# Patient Record
Sex: Male | Born: 1990 | Race: Black or African American | Hispanic: No | Marital: Single | Smoking: Current every day smoker
Health system: Southern US, Community
[De-identification: ages and names within clinical notes are randomized; demographics above are authoritative.]

---
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
## 2011-07-03 ENCOUNTER — Inpatient Hospital Stay (INDEPENDENT_AMBULATORY_CARE_PROVIDER_SITE_OTHER)
Admission: RE | Admit: 2011-07-03 | Discharge: 2011-07-03 | Disposition: A | Discharge status: Home / Self Care | Payer: Self-pay | Source: Ambulatory Visit | Attending: Emergency Medicine | Admitting: Emergency Medicine

## 2011-07-03 DIAGNOSIS — S0003XA Contusion of scalp, initial encounter: Secondary | ICD-10-CM

## 2011-07-03 LAB — RAPID URINE DRUG SCREEN, HOSP PERFORMED
Amphetamines: NOT DETECTED
Barbiturates: NOT DETECTED
Benzodiazepines: NOT DETECTED
Cocaine: NOT DETECTED
Opiates: NOT DETECTED
Tetrahydrocannabinol: NOT DETECTED

## 2011-10-16 ENCOUNTER — Emergency Department (HOSPITAL_COMMUNITY)
Admission: EM | Admit: 2011-10-16 | Discharge: 2011-10-16 | Payer: Self-pay | Source: Home / Self Care | Attending: Family Medicine | Admitting: Family Medicine

## 2011-10-16 NOTE — ED Notes (Signed)
Pt is no longer at [facility], per Registration Pt left [facility].

## 2014-04-11 ENCOUNTER — Encounter (HOSPITAL_COMMUNITY): Payer: Self-pay | Admitting: Emergency Medicine

## 2014-04-11 ENCOUNTER — Emergency Department (HOSPITAL_COMMUNITY)
Admission: EM | Admit: 2014-04-11 | Discharge: 2014-04-11 | Disposition: A | Discharge status: Home / Self Care | Payer: Self-pay | Attending: Emergency Medicine | Admitting: Emergency Medicine

## 2014-04-11 DIAGNOSIS — K625 Hemorrhage of anus and rectum: Secondary | ICD-10-CM | POA: Insufficient documentation

## 2014-04-11 DIAGNOSIS — R110 Nausea: Secondary | ICD-10-CM | POA: Insufficient documentation

## 2014-04-11 DIAGNOSIS — F17200 Nicotine dependence, unspecified, uncomplicated: Secondary | ICD-10-CM | POA: Insufficient documentation

## 2014-04-11 DIAGNOSIS — R197 Diarrhea, unspecified: Secondary | ICD-10-CM | POA: Insufficient documentation

## 2014-04-11 LAB — COMPREHENSIVE METABOLIC PANEL
ALT: 25 U/L (ref 0–53)
AST: 23 U/L (ref 0–37)
Albumin: 3.9 g/dL (ref 3.5–5.2)
Alkaline Phosphatase: 87 U/L (ref 39–117)
Anion gap: 11 (ref 5–15)
BUN: 19 mg/dL (ref 6–23)
CO2: 24 mEq/L (ref 19–32)
Calcium: 9.1 mg/dL (ref 8.4–10.5)
Chloride: 109 mEq/L (ref 96–112)
Creatinine, Ser: 1.14 mg/dL (ref 0.50–1.35)
GFR calc Af Amer: >90 mL/min (ref >90)
GFR calc non Af Amer: >90 mL/min (ref >90)
Glucose, Bld: 84 mg/dL (ref 70–99)
Potassium: 4.4 mEq/L (ref 3.7–5.3)
Sodium: 144 mEq/L (ref 137–147)
Total Bilirubin: 0.3 mg/dL (ref 0.3–1.2)
Total Protein: 7.7 g/dL (ref 6.0–8.3)

## 2014-04-11 LAB — URINE MICROSCOPIC-ADD ON

## 2014-04-11 LAB — CBC WITH DIFFERENTIAL/PLATELET
Basophils Absolute: 0 10*3/uL (ref 0.0–0.1)
Basophils Relative: 0 % (ref 0–1)
Eosinophils Absolute: 0.1 10*3/uL (ref 0.0–0.7)
Eosinophils Relative: 1 % (ref 0–5)
HCT: 44.4 % (ref 39.0–52.0)
Hemoglobin: 15 g/dL (ref 13.0–17.0)
Lymphocytes Relative: 20 % (ref 12–46)
Lymphs Abs: 2.7 10*3/uL (ref 0.7–4.0)
MCH: 27.6 pg (ref 26.0–34.0)
MCHC: 33.8 g/dL (ref 30.0–36.0)
MCV: 81.8 fL (ref 78.0–100.0)
Monocytes Absolute: 1.2 10*3/uL — ABNORMAL HIGH (ref 0.1–1.0)
Monocytes Relative: 9 % (ref 3–12)
Neutro Abs: 9.5 10*3/uL — ABNORMAL HIGH (ref 1.7–7.7)
Neutrophils Relative %: 70 % (ref 43–77)
Platelets: 235 10*3/uL (ref 150–400)
RBC: 5.43 MIL/uL (ref 4.22–5.81)
RDW: 14.4 % (ref 11.5–15.5)
WBC: 13.5 10*3/uL — ABNORMAL HIGH (ref 4.0–10.5)

## 2014-04-11 LAB — URINALYSIS, ROUTINE W REFLEX MICROSCOPIC
Bilirubin Urine: NEGATIVE
Glucose, UA: NEGATIVE mg/dL
Ketones, ur: NEGATIVE mg/dL
Leukocytes, UA: NEGATIVE
Nitrite: NEGATIVE
Protein, ur: NEGATIVE mg/dL
Specific Gravity, Urine: 1.024 (ref 1.005–1.030)
Urobilinogen, UA: 0.2 mg/dL (ref 0.0–1.0)
pH: 6 (ref 5.0–8.0)

## 2014-04-11 LAB — POC OCCULT BLOOD, ED: Fecal Occult Bld: POSITIVE — AB

## 2014-04-11 MED ORDER — LOPERAMIDE HCL 2 MG PO CAPS
2.0000 mg | ORAL_CAPSULE | Freq: Once | ORAL | Status: AC
  Administered 2014-04-11: 2 mg via ORAL
  Filled 2014-04-11: qty 1

## 2014-04-11 NOTE — ED Notes (Signed)
EMT assisted with POC rectal exam

## 2014-04-11 NOTE — ED Notes (Signed)
Pt. reports diarrhea , nausea and low abdominal cramping onset 2 days ago , rectal bleeding today . Denies fever or chills.

## 2014-04-11 NOTE — ED Provider Notes (Signed)
CSN: [id number]     Arrival date & time 04/11/14  2029 History   First MD Initiated Contact with Patient 04/11/14 2115     Chief Complaint  Patient presents with  . Diarrhea  . Rectal Bleeding     (Consider location/radiation/quality/duration/timing/severity/associated sxs/prior Treatment) HPI Comments: Patient states he's had diarrhea and nausea.  For the past 2, days.  He, states the diarrhea is improving, so this, afternoon.  He took a laxative thinking that it would for his stools at 2:00 and 6:00 he had bowel movements and when he wiped.  There was blood on the tissue and a small amount of blood in the stool.  He denies any abdominal pain, fevers, dysuria, previous history of same   Patient is a 23 y.o. male presenting with diarrhea and hematochezia. The history is provided by the patient.  Diarrhea Quality:  Blood-tinged Severity:  Mild Onset quality:  Sudden Timing:  Intermittent Progression:  Unchanged Relieved by:  Nothing Worsened by:  Nothing tried Ineffective treatments:  None tried Associated symptoms: no abdominal pain, no chills, no fever, no myalgias and no vomiting   Rectal Bleeding Associated symptoms: no abdominal pain, no dizziness, no fever, no light-headedness and no vomiting     History reviewed. No pertinent past medical history. History reviewed. No pertinent past surgical history. No family history on file. History  Substance Use Topics  . Smoking status: Current Every Day Smoker  . Smokeless tobacco: Not on file  . Alcohol Use: Yes    Review of Systems  Constitutional: Negative for fever and chills.  Gastrointestinal: Positive for nausea, diarrhea, blood in stool and hematochezia. Negative for vomiting and abdominal pain.  Genitourinary: Negative for dysuria.  Musculoskeletal: Negative for myalgias.  Skin: Negative for wound.  Neurological: Negative for dizziness and light-headedness.  All other systems reviewed and are  negative.     Allergies  Review of patient's allergies indicates no known allergies.  Home Medications   Prior to Admission medications   Not on File   BP 142/87  Pulse 96  Temp(Src) 98.5 F (36.9 C) (Oral)  Resp 18  Ht 5\' 8"  (1.727 m)  Wt 279 lb 8 oz (126.78 kg)  BMI 42.51 kg/m2  SpO2 99% Physical Exam  Nursing note and vitals reviewed. Constitutional: He appears well-developed and well-nourished.  HENT:  Head: Normocephalic.  Mouth/Throat: Oropharynx is clear and moist.  Eyes: Pupils are equal, round, and reactive to light.  Neck: Normal range of motion.  Cardiovascular: Normal rate and regular rhythm.   Pulmonary/Chest: Effort normal and breath sounds normal.  Abdominal: Soft. Bowel sounds are normal. He exhibits no distension. There is no tenderness.  Genitourinary: Guaiac positive stool.  Musculoskeletal: Normal range of motion.  Neurological: He is alert.  Skin: Skin is warm.    ED Course  Procedures (including critical care time) Labs Review Labs Reviewed  URINALYSIS, ROUTINE W REFLEX MICROSCOPIC - Abnormal; Notable for the following:    Hgb urine dipstick TRACE (*)    All other components within normal limits  CBC WITH DIFFERENTIAL - Abnormal; Notable for the following:    WBC 13.5 (*)    Neutro Abs 9.5 (*)    Monocytes Absolute 1.2 (*)    All other components within normal limits  POC OCCULT BLOOD, ED - Abnormal; Notable for the following:    Fecal Occult Bld POSITIVE (*)    All other components within normal limits  COMPREHENSIVE METABOLIC PANEL  URINE MICROSCOPIC-ADD ON  Imaging Review No results found.   EKG Interpretation None      MDM  Patient.  Won't work, and urine are normal.  He does have a positive guaiac test on a stool card, but do to a more than adequate.  Hemoglobin and hematocrit, doubt that this is a lower GI bleed, but more likely, a irritation from his recent diarrhea.  Patient will be discharged home with Imodium with  strict instructions to return if he has worsening.  Symptoms Final diagnoses:  Diarrhea  Rectal bleed         Arman Filter, NP 04/11/14 2209

## 2014-04-11 NOTE — ED Notes (Signed)
VSS. Pt given discharge instructions. All questions answered. Pt ambulatory on discharge.

## 2014-04-11 NOTE — Discharge Instructions (Signed)
Bloody Stools Bloody stools means there is blood in your poop (stool). It is a sign that there is a problem somewhere in the digestive system. It is important for your doctor to find the cause of your bleeding, so the problem can be treated.  HOME CARE  Only take medicine as told by your doctor.  Eat foods with fiber (prunes, bran cereals).  Drink enough fluids to keep your pee (urine) clear or pale yellow.  Sit in warm water (sitz bath) for 10 to 15 minutes as told by your doctor.  Know how to take your medicines (enemas, suppositories) if advised by your doctor.  Watch for signs that you are getting better or getting worse. GET HELP RIGHT AWAY IF:   You are not getting better.  You start to get better but then get worse again.  You have new problems.  You have severe bleeding from the place where poop comes out (rectum) that does not stop.  You throw up (vomit) blood.  You feel weak or pass out (faint).  You have a fever. MAKE SURE YOU:   Understand these instructions.  Will watch your condition.  Will get help right away if you are not doing well or get worse. Document Released: 08/19/2009 Document Revised: 11/23/2011 Document Reviewed: 01/16/2011 [facility] Patient Information 2015 [place], [place]. This information is not intended to replace advice given to you by your health care provider. Make sure you discuss any questions you have with your health care provider. U. been given a medication in the emergency department, with Imodium.  This should help from your stools, up we'll also begin a dietary outline.  Please try to follow this for the next 24-48 hours, you do have a small amount of blood in your stool sample, but your hemoglobin and hematocrit are more than adequate and, you do not have any sign of infection.  Please watch her stools carefully over the next 2 days if you continue to have a large amount of bleeding or increased amount of bleeding from the rectum.  To  become weak, or dizzy.  Please return immediately for further evaluation

## 2014-04-12 NOTE — ED Provider Notes (Signed)
  Medical screening examination/treatment/procedure(s) were performed by non-physician practitioner and as supervising physician I was immediately available for consultation/collaboration.   EKG Interpretation None         Gerhard Munchobert Victoriana Aziz, MD 04/12/14 [phone]

## 2016-07-12 ENCOUNTER — Emergency Department (HOSPITAL_COMMUNITY): Payer: Self-pay

## 2016-07-12 ENCOUNTER — Encounter (HOSPITAL_COMMUNITY): Payer: Self-pay | Admitting: Emergency Medicine

## 2016-07-12 ENCOUNTER — Emergency Department (HOSPITAL_COMMUNITY)
Admission: EM | Admit: 2016-07-12 | Discharge: 2016-07-12 | Disposition: A | Discharge status: Home / Self Care | Payer: Self-pay | Attending: Emergency Medicine | Admitting: Emergency Medicine

## 2016-07-12 DIAGNOSIS — R0789 Other chest pain: Secondary | ICD-10-CM | POA: Insufficient documentation

## 2016-07-12 DIAGNOSIS — F17200 Nicotine dependence, unspecified, uncomplicated: Secondary | ICD-10-CM | POA: Insufficient documentation

## 2016-07-12 MED ORDER — GI COCKTAIL ~~LOC~~
30.0000 mL | Freq: Once | ORAL | Status: AC
  Administered 2016-07-12: 30 mL via ORAL
  Filled 2016-07-12: qty 30

## 2016-07-12 MED ORDER — IBUPROFEN 800 MG PO TABS: 800.0000 mg | ORAL_TABLET | Freq: Three times a day (TID) | ORAL | 0 refills | Status: DC

## 2016-07-12 MED ORDER — KETOROLAC TROMETHAMINE 60 MG/2ML IM SOLN
60.0000 mg | Freq: Once | INTRAMUSCULAR | Status: AC
  Administered 2016-07-12: 60 mg via INTRAMUSCULAR
  Filled 2016-07-12: qty 2

## 2016-07-12 NOTE — ED Provider Notes (Signed)
[facility]-EMERGENCY DEPT Provider Note   CSN: [id number] Arrival date & time: 07/12/16  0122   By signing my name below, I, Suzan Slick. Elon Spanner, attest that this documentation has been prepared under the direction and in the presence of Alecea Trego, MD.  Electronically Signed: Suzan Slick. Elon Spanner, ED Scribe. 07/12/16. 3:14 AM.   History   Chief Complaint Chief Complaint  Patient presents with  . Chest Pain   The history is provided by the patient. No language interpreter was used.  Chest Pain   This is a new problem. The current episode started more than 1 week ago. The problem has not changed since onset.Associated with: positions. The pain is present in the substernal region. The pain is at a severity of 1/10. The pain is mild. The quality of the pain is described as dull. The pain does not radiate. The symptoms are aggravated by certain positions. Pertinent negatives include no abdominal pain, no diaphoresis, no exertional chest pressure, no fever, no nausea, no orthopnea, no palpitations and no vomiting. He has tried nothing for the symptoms. The treatment provided no relief.    HPI Comments: Samuel Zamora is a 25 y.o. male without any pertinent past medical history who presents to the Emergency Department complaining of intermittent, unchanged L sided chest pain x few weeks. Pain is described as tightness and rated 1/10 currently. No recent heavy lifting or repetitive motions. Pt states chest pain is worse during the day. Typically pain is relieved some when standing. He also reports intermittent shortness of breath and HAs. No OTC medications or home remedies attempted prior to arrival. No recent fever, chills, nausea, or vomiting. No recent long distance travel. No recent surgeries. Pt admits to occasional Marijuana use.  PCP: No PCP Per Patient    History reviewed. No pertinent past medical history.  There are no active problems to display for this patient.   History reviewed. No  pertinent surgical history.   Family History No family history on file.  Social History Social History  Substance Use Topics  . Smoking status: Current Every Day Smoker  . Smokeless tobacco: Never Used  . Alcohol use Yes     Allergies   Review of patient's allergies indicates no known allergies.   Review of Systems Review of Systems  Constitutional: Negative for chills, diaphoresis and fever.  Cardiovascular: Positive for chest pain. Negative for palpitations and orthopnea.  Gastrointestinal: Negative for abdominal pain, nausea and vomiting.  Psychiatric/Behavioral: Negative for confusion.  All other systems reviewed and are negative.    Physical Exam Updated Vital Signs BP 121/90 (BP Location: Left Arm)   Pulse 91   Temp 98.4 F (36.9 C) (Oral)   Resp 18   Ht 5\' 9"  (1.753 m)   SpO2 99%   Physical Exam  Constitutional: He is oriented to person, place, and time. He appears well-developed and well-nourished.  HENT:  Head: Normocephalic and atraumatic.  Mouth/Throat: Oropharynx is clear and moist.  Eyes: EOM are normal.  Neck: Normal range of motion.  No bruits. Trachea is midline.  Cardiovascular: Normal rate, regular rhythm, normal heart sounds and intact distal pulses.   Pulmonary/Chest: Effort normal and breath sounds normal. No stridor. No respiratory distress. He has no wheezes. He has no rales. He exhibits tenderness.  Abdominal: Soft. Bowel sounds are normal. He exhibits no distension. There is no tenderness.  Musculoskeletal: Normal range of motion.  Neurological: He is alert and oriented to person, place, and time.  Skin: Skin  is warm and dry. Capillary refill takes less than 2 seconds.  Psychiatric: He has a normal mood and affect. Judgment normal.  Nursing note and vitals reviewed.    ED Treatments / Results   Vitals:   07/12/16 0133 07/12/16 0336  BP: 121/90 132/95  Pulse: 91 69  Resp: 18 17  Temp: 98.4 F (36.9 C)    Results for orders  placed or performed during the hospital encounter of 04/11/14  Urinalysis, Routine w reflex microscopic  Result Value Ref Range   Color, Urine YELLOW YELLOW   APPearance CLEAR CLEAR   Specific Gravity, Urine 1.024 1.005 - 1.030   pH 6.0 5.0 - 8.0   Glucose, UA NEGATIVE NEGATIVE mg/dL   Hgb urine dipstick TRACE (A) NEGATIVE   Bilirubin Urine NEGATIVE NEGATIVE   Ketones, ur NEGATIVE NEGATIVE mg/dL   Protein, ur NEGATIVE NEGATIVE mg/dL   Urobilinogen, UA 0.2 0.0 - 1.0 mg/dL   Nitrite NEGATIVE NEGATIVE   Leukocytes, UA NEGATIVE NEGATIVE  CBC with Differential  Result Value Ref Range   WBC 13.5 (H) 4.0 - 10.5 K/uL   RBC 5.43 4.22 - 5.81 MIL/uL   Hemoglobin 15.0 13.0 - 17.0 g/dL   HCT 16.144.4 09.639.0 - 04.552.0 %   MCV 81.8 78.0 - 100.0 fL   MCH 27.6 26.0 - 34.0 pg   MCHC 33.8 30.0 - 36.0 g/dL   RDW 40.914.4 81.111.5 - 91.415.5 %   Platelets 235 150 - 400 K/uL   Neutrophils Relative % 70 43 - 77 %   Neutro Abs 9.5 (H) 1.7 - 7.7 K/uL   Lymphocytes Relative 20 12 - 46 %   Lymphs Abs 2.7 0.7 - 4.0 K/uL   Monocytes Relative 9 3 - 12 %   Monocytes Absolute 1.2 (H) 0.1 - 1.0 K/uL   Eosinophils Relative 1 0 - 5 %   Eosinophils Absolute 0.1 0.0 - 0.7 K/uL   Basophils Relative 0 0 - 1 %   Basophils Absolute 0.0 0.0 - 0.1 K/uL  Comprehensive metabolic panel  Result Value Ref Range   Sodium 144 137 - 147 mEq/L   Potassium 4.4 3.7 - 5.3 mEq/L   Chloride 109 96 - 112 mEq/L   CO2 24 19 - 32 mEq/L   Glucose, Bld 84 70 - 99 mg/dL   BUN 19 6 - 23 mg/dL   Creatinine, Ser 7.821.14 0.50 - 1.35 mg/dL   Calcium 9.1 8.4 - 95.610.5 mg/dL   Total Protein 7.7 6.0 - 8.3 g/dL   Albumin 3.9 3.5 - 5.2 g/dL   AST 23 0 - 37 U/L   ALT 25 0 - 53 U/L   Alkaline Phosphatase 87 39 - 117 U/L   Total Bilirubin 0.3 0.3 - 1.2 mg/dL   GFR calc non Af Amer >90 >90 mL/min   GFR calc Af Amer >90 >90 mL/min   Anion gap 11 5 - 15  Urine microscopic-add on  Result Value Ref Range   Squamous Epithelial / LPF RARE RARE   WBC, UA 0-2 <3 WBC/hpf    RBC / HPF 3-6 <3 RBC/hpf   Bacteria, UA RARE RARE   Urine-Other MUCOUS PRESENT   POC occult blood, ED RN will collect  Result Value Ref Range   Fecal Occult Bld POSITIVE (A) NEGATIVE   Dg Chest 2 View  Result Date: 07/12/2016 CLINICAL DATA:  Chest tightness and shortness of breath EXAM: CHEST  2 VIEW COMPARISON:  None. FINDINGS: Heart size is borderline enlarged. No edema.  No infiltrate. No effusion. No pneumothorax. IMPRESSION: Borderline cardiomegaly.  No acute infiltrate or edema. Electronically Signed   By: Jasmine PangKim  Fujinaga M.D.   On: 07/12/2016 01:54    DIAGNOSTIC STUDIES: Oxygen Saturation is 99% on RA, Normal by my interpretation.    COORDINATION OF CARE: 3:15 AM- Will order blood work and EKG. Will give GI cocktail and Toradol. Discussed treatment plan with pt at bedside and pt agreed to plan.    EKG  EKG Interpretation  Date/Time:  Sunday July 12 2016 01:45:29 EDT Ventricular Rate:  83 PR Interval:    QRS Duration: 90 QT Interval:  340 QTC Calculation: 400 R Axis:   31 Text Interpretation:  Sinus rhythm Confirmed by [facility] M Yel[facility] CommunityALUMBO-RASCH  MD, Wylder Macomber ([id number]) on 07/12/2016 1:55:56 AM       Radiology Dg Chest 2 View  Result Date: 07/12/2016 CLINICAL DATA:  Chest tightness and shortness of breath EXAM: CHEST  2 VIEW COMPARISON:  None. FINDINGS: Heart size is borderline enlarged. No edema. No infiltrate. No effusion. No pneumothorax. IMPRESSION: Borderline cardiomegaly.  No acute infiltrate or edema. Electronically Signed   By: Jasmine PangKim  Fujinaga M.D.   On: 07/12/2016 01:54    Procedures Procedures (including critical care time)  Medications Ordered in ED Medications  gi cocktail (Maalox,Lidocaine,Donnatal) (30 mLs Oral Given 07/12/16 0332)  ketorolac (TORADOL) injection 60 mg (60 mg Intramuscular Given 07/12/16 0332)   PERC negative wells 0 highly doubt PE in this very low risk patient.   HEART score 1 highly doubt cardiac chest pain as EKG is normal and symptoms are  ongoing for days   Final Clinical Impressions(s) / ED Diagnoses  Chest wall pain:   All questions answered to patient's parents satisfaction. Based on history and exam patient has been appropriately medically screened and emergency conditions excluded. Patient is stable for discharge at this time. Follow up with your PMD for recheck in 2 days and strict return precautions given.   New Prescriptions New Prescriptions   No medications on file  I personally performed the services described in this documentation, which was scribed in my presence. The recorded information has been reviewed and is accurate.      Cy BlamerApril Azaylia Fong, MD 07/12/16 [id number]
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
## 2018-04-17 ENCOUNTER — Emergency Department
Admission: EM | Admit: 2018-04-17 | Discharge: 2018-04-17 | Disposition: A | Discharge status: Home / Self Care | Payer: Self-pay | Attending: Emergency Medicine | Admitting: Emergency Medicine

## 2018-04-17 ENCOUNTER — Other Ambulatory Visit: Payer: Self-pay

## 2018-04-17 ENCOUNTER — Encounter: Payer: Self-pay | Admitting: Emergency Medicine

## 2018-04-17 DIAGNOSIS — L03115 Cellulitis of right lower limb: Secondary | ICD-10-CM | POA: Insufficient documentation

## 2018-04-17 DIAGNOSIS — L02415 Cutaneous abscess of right lower limb: Secondary | ICD-10-CM | POA: Insufficient documentation

## 2018-04-17 DIAGNOSIS — F17200 Nicotine dependence, unspecified, uncomplicated: Secondary | ICD-10-CM | POA: Insufficient documentation

## 2018-04-17 LAB — CBC WITH DIFFERENTIAL/PLATELET
Basophils Absolute: 0.1 10*3/uL (ref 0–0.1)
Basophils Relative: 0 %
EOS PCT: 0 %
Eosinophils Absolute: 0.1 10*3/uL (ref 0–0.7)
HEMATOCRIT: 47.6 % (ref 40.0–52.0)
Hemoglobin: 15.7 g/dL (ref 13.0–18.0)
LYMPHS PCT: 7 %
Lymphs Abs: 1.4 10*3/uL (ref 1.0–3.6)
MCH: 27 pg (ref 26.0–34.0)
MCHC: 33 g/dL (ref 32.0–36.0)
MCV: 81.8 fL (ref 80.0–100.0)
MONOS PCT: 7 %
Monocytes Absolute: 1.5 10*3/uL — ABNORMAL HIGH (ref 0.2–1.0)
NEUTROS ABS: 18.1 10*3/uL — AB (ref 1.4–6.5)
Neutrophils Relative %: 86 %
PLATELETS: 226 10*3/uL (ref 150–440)
RBC: 5.82 MIL/uL (ref 4.40–5.90)
RDW: 15 % — ABNORMAL HIGH (ref 11.5–14.5)
WBC: 21.2 10*3/uL — ABNORMAL HIGH (ref 3.8–10.6)

## 2018-04-17 LAB — BASIC METABOLIC PANEL
ANION GAP: 8 (ref 5–15)
BUN: 16 mg/dL (ref 6–20)
CO2: 26 mmol/L (ref 22–32)
Calcium: 9 mg/dL (ref 8.9–10.3)
Chloride: 103 mmol/L (ref 98–111)
Creatinine, Ser: 1.09 mg/dL (ref 0.61–1.24)
GFR calc Af Amer: >60 mL/min (ref >60)
GFR calc non Af Amer: >60 mL/min (ref >60)
GLUCOSE: 100 mg/dL — AB (ref 70–99)
POTASSIUM: 3.4 mmol/L — AB (ref 3.5–5.1)
SODIUM: 137 mmol/L (ref 135–145)

## 2018-04-17 MED ORDER — CEPHALEXIN 500 MG PO CAPS: 500.0000 mg | ORAL_CAPSULE | Freq: Two times a day (BID) | ORAL | 0 refills | Status: AC

## 2018-04-17 MED ORDER — SULFAMETHOXAZOLE-TRIMETHOPRIM 800-160 MG PO TABS: 1.0000 | ORAL_TABLET | Freq: Two times a day (BID) | ORAL | 0 refills | Status: DC

## 2018-04-17 MED ORDER — SODIUM CHLORIDE 0.9 % IV SOLN
1.0000 g | Freq: Once | INTRAVENOUS | Status: AC
  Administered 2018-04-17: 1 g via INTRAVENOUS
  Filled 2018-04-17: qty 10

## 2018-04-17 MED ORDER — NAPROXEN 500 MG PO TABS: 500.0000 mg | ORAL_TABLET | Freq: Two times a day (BID) | ORAL | 0 refills | Status: DC

## 2018-04-17 MED ORDER — SODIUM CHLORIDE 0.9 % IV BOLUS
1000.0000 mL | Freq: Once | INTRAVENOUS | Status: AC
Start: 2018-04-17 — End: 2018-04-17
  Administered 2018-04-17: 1000 mL via INTRAVENOUS

## 2018-04-17 MED ORDER — HYDROCODONE-ACETAMINOPHEN 5-325 MG PO TABS: 1.0000 | ORAL_TABLET | Freq: Four times a day (QID) | ORAL | 0 refills | Status: AC | PRN

## 2018-04-17 NOTE — ED Provider Notes (Signed)
[facility] Emergency Department Provider Note  ____________________________________________  Time seen: Approximately 3:24 PM  I have reviewed the triage vital signs and the nursing notes.   HISTORY  Chief Complaint Insect Bite   HPI Samuel Zamora is a 27 y.o. male who presents to the emergency department for treatment and evaluation of right lower extremity lesion. He noticed a small pimple like area on the outside of the right knee 2-3 days ago. He squeezed it and got some pus out, but since then the area has become warm, red, and has gotten much larger.   History reviewed. No pertinent past medical history.  There are no active problems to display for this patient.   History reviewed. No pertinent surgical history.  Prior to Admission medications   Medication Sig Start Date End Date Taking? Authorizing Provider  cephALEXin (KEFLEX) 500 MG capsule Take 1 capsule (500 mg total) by mouth 2 (two) times daily for 10 days. 04/17/18 04/27/18  Karmah Potocki, Rulon Eisenmenger B, FNP  HYDROcodone-acetaminophen (NORCO/VICODIN) 5-325 MG tablet Take 1 tablet by mouth every 6 (six) hours as needed for up to 3 days for severe pain. 04/17/18 04/20/18  Leanne Sisler, Rulon Eisenmenger B, FNP  ibuprofen (ADVIL,MOTRIN) 800 MG tablet Take 1 tablet (800 mg total) by mouth 3 (three) times daily. 07/12/16   Palumbo, April, MD  naproxen (NAPROSYN) 500 MG tablet Take 1 tablet (500 mg total) by mouth 2 (two) times daily with a meal. 04/17/18   Carli Lefevers B, FNP  sulfamethoxazole-trimethoprim (BACTRIM DS,SEPTRA DS) 800-160 MG tablet Take 1 tablet by mouth 2 (two) times daily. 04/17/18   Chinita Pester, FNP    Allergies Patient has no known allergies.  No family history on file.  Social History Social History   Tobacco Use  . Smoking status: Current Every Day Smoker  . Smokeless tobacco: Never Used  Substance Use Topics  . Alcohol use: Yes  . Drug use: No    Review of Systems  Constitutional: Positive  for fever. Respiratory: Negative for cough or shortness of breath.  Musculoskeletal: Negative for myalgias Skin: Positive for draining lesion. Neurological: Negative for numbness or paresthesias. ____________________________________________   PHYSICAL EXAM:  VITAL SIGNS: ED Triage Vitals  Enc Vitals Group     BP 04/17/18 1517 132/86     Pulse Rate 04/17/18 1517 (!) 106     Resp 04/17/18 1517 16     Temp 04/17/18 1517 100 F (37.8 C)     Temp Source 04/17/18 1517 Oral     SpO2 04/17/18 1517 95 %     Weight 04/17/18 1508 280 lb (127 kg)     Height 04/17/18 1517 5\' 9"  (1.753 m)     Head Circumference --      Peak Flow --      Pain Score 04/17/18 1508 0     Pain Loc --      Pain Edu? --      Excl. in GC? --      Constitutional: Well appearing. Eyes: Conjunctivae are clear without discharge or drainage. Nose: No rhinorrhea noted. Mouth/Throat: Airway is patent.  Neck: No stridor. Unrestricted range of motion observed. Cardiovascular: Capillary refill is <3 seconds.  Respiratory: Respirations are even and unlabored.. Musculoskeletal: Unrestricted range of motion observed. Neurologic: Awake, alert, and oriented x 4.  Skin: Lesion on the lateral aspect of the proximal calf draining purulent and serosanguineous fluid.  Lesion is indurated.  Surrounding erythema extending upward toward the knee and downward into  the lateral calf.   ____________________________________________   LABS (all labs ordered are listed, but only abnormal results are displayed)  Labs Reviewed  CBC WITH DIFFERENTIAL/PLATELET - Abnormal; Notable for the following components:      Result Value   WBC 21.2 (*)    RDW 15.0 (*)    Neutro Abs 18.1 (*)    Monocytes Absolute 1.5 (*)    All other components within normal limits  BASIC METABOLIC PANEL - Abnormal; Notable for the following components:   Potassium 3.4 (*)    Glucose, Bld 100 (*)    All other components within normal limits    ____________________________________________  EKG  Not indicated. ____________________________________________  RADIOLOGY  Not indicated. ____________________________________________   PROCEDURES  Procedures ____________________________________________   INITIAL IMPRESSION / ASSESSMENT AND PLAN / ED COURSE  Erenest BlankBrandon J Mittelman is a 27 y.o. male who presents to the emergency department for treatment and evaluation of a lesion to the right lower extremity that is been present for the past 2 to 3 days.  Symptoms and exam concerning for cellulitis. Plan will be to check baseline labs, give fluids and IV rocephin.   ----------------------------------------- 4:58 PM on 04/17/2018 -----------------------------------------  Labs discussed with the patient.  He will be treated with double coverage Bactrim and Keflex and advised to use warm compresses 4 times per day.  He will return in 2 days for a recheck of the wound.  He is aware that he should return sooner for any worsening of symptoms including increasing fever, nausea, increase in pain, or increase and size of erythema.    Medications  sodium chloride 0.9 % bolus 1,000 mL (0 mLs Intravenous Stopped 04/17/18 1718)  cefTRIAXone (ROCEPHIN) 1 g in sodium chloride 0.9 % 100 mL IVPB (0 g Intravenous Stopped 04/17/18 1630)     Pertinent labs & imaging results that were available during my care of the patient were reviewed by me and considered in my medical decision making (see chart for details).  ____________________________________________   FINAL CLINICAL IMPRESSION(S) / ED DIAGNOSES  Final diagnoses:  Abscess of leg, right  Cellulitis of right lower extremity    ED Discharge Orders        Ordered    sulfamethoxazole-trimethoprim (BACTRIM DS,SEPTRA DS) 800-160 MG tablet  2 times daily     04/17/18 1712    cephALEXin (KEFLEX) 500 MG capsule  2 times daily     04/17/18 1712    naproxen (NAPROSYN) 500 MG tablet  2 times daily  with meals     04/17/18 1712    HYDROcodone-acetaminophen (NORCO/VICODIN) 5-325 MG tablet  Every 6 hours PRN     04/17/18 1712       Note:  This document was prepared using Dragon voice recognition software and may include unintentional dictation errors.    Chinita Pesterriplett, Kendyll Huettner B, FNP 04/17/18 1904    Phineas SemenGoodman, Graydon, MD 04/17/18 [phone]

## 2018-04-17 NOTE — Discharge Instructions (Signed)
Come back to the ER on Tuesday for a recheck. Come back sooner if symptoms get any worse. Use a warm compress on the area 4 times per day to encourage drainage.

## 2018-04-17 NOTE — ED Notes (Signed)
Pt reports awoke Thursday to a small pimple on the lateral aspect of his right leg just below the knee. Pt states that he popped it and since then the area has gotten worse. Pt with red areas noted to area and open lesion in center. Pt also with slight swelling around his knee.

## 2018-04-17 NOTE — ED Triage Notes (Signed)
C/O spider bite to right lower leg.  Noticed pain and swelling since Thursday.

## 2018-04-17 NOTE — ED Notes (Signed)
Pt verbalizes understanding of d/c instructions, medications and follow up 

## 2018-04-19 ENCOUNTER — Emergency Department
Admission: EM | Admit: 2018-04-19 | Discharge: 2018-04-19 | Disposition: A | Discharge status: Home / Self Care | Payer: Self-pay | Attending: Emergency Medicine | Admitting: Emergency Medicine

## 2018-04-19 ENCOUNTER — Encounter: Payer: Self-pay | Admitting: Emergency Medicine

## 2018-04-19 DIAGNOSIS — L03115 Cellulitis of right lower limb: Secondary | ICD-10-CM | POA: Insufficient documentation

## 2018-04-19 DIAGNOSIS — F17200 Nicotine dependence, unspecified, uncomplicated: Secondary | ICD-10-CM | POA: Insufficient documentation

## 2018-04-19 MED ORDER — OXYCODONE-ACETAMINOPHEN 7.5-325 MG PO TABS: 1.0000 | ORAL_TABLET | Freq: Four times a day (QID) | ORAL | 0 refills | Status: DC | PRN

## 2018-04-19 NOTE — ED Notes (Signed)
Cleaned area with normal saline and dressing applied

## 2018-04-19 NOTE — ED Notes (Signed)
See triage note    Presents for wound check to right lateral lower leg  States he thinks it may have been a spider bite   States area became larger yesterday and developed a blister  States the blister popped while cleaning yesterday

## 2018-04-19 NOTE — Discharge Instructions (Addendum)
Continue antibiotics and daily dressing change.

## 2018-04-19 NOTE — ED Provider Notes (Signed)
[facility] Emergency Department Provider Note   ____________________________________________   First MD Initiated Contact with Patient 04/19/18 1323     (approximate)  I have reviewed the triage vital signs and the nursing notes.   HISTORY  Chief Complaint Wound Check    HPI Samuel Zamora is a 27 y.o. male patient presents for reevaluation of cellulitis right leg.  Patient was seen 3 days ago.  Patient was given IV antibiotics and a prescription for Keflex and Bactrim DS.  There was a delay in patient picking up the antibiotics for 1 day.  Patient started antibiotics yesterday.  Patient state he did not have him return for dressing change of using tissue paper to cover the wound.  Patient state notes increased drainage today but no pain.  History reviewed. No pertinent past medical history.  There are no active problems to display for this patient.   History reviewed. No pertinent surgical history.  Prior to Admission medications   Medication Sig Start Date End Date Taking? Authorizing Provider  cephALEXin (KEFLEX) 500 MG capsule Take 1 capsule (500 mg total) by mouth 2 (two) times daily for 10 days. 04/17/18 04/27/18  Triplett, Rulon Eisenmengerari B, FNP  HYDROcodone-acetaminophen (NORCO/VICODIN) 5-325 MG tablet Take 1 tablet by mouth every 6 (six) hours as needed for up to 3 days for severe pain. 04/17/18 04/20/18  Triplett, Rulon Eisenmengerari B, FNP  ibuprofen (ADVIL,MOTRIN) 800 MG tablet Take 1 tablet (800 mg total) by mouth 3 (three) times daily. 07/12/16   Palumbo, April, MD  naproxen (NAPROSYN) 500 MG tablet Take 1 tablet (500 mg total) by mouth 2 (two) times daily with a meal. 04/17/18   Triplett, Cari B, FNP  oxyCODONE-acetaminophen (PERCOCET) 7.5-325 MG tablet Take 1 tablet by mouth every 6 (six) hours as needed for severe pain. 04/19/18   Joni ReiningSmith, Ronald K, PA-C  sulfamethoxazole-trimethoprim (BACTRIM DS,SEPTRA DS) 800-160 MG tablet Take 1 tablet by mouth 2 (two) times daily. 04/17/18    Chinita Pesterriplett, Cari B, FNP    Allergies Patient has no known allergies.  No family history on file.  Social History Social History   Tobacco Use  . Smoking status: Current Every Day Smoker  . Smokeless tobacco: Never Used  Substance Use Topics  . Alcohol use: Yes  . Drug use: No    Review of Systems Constitutional: No fever/chills Eyes: No visual changes. ENT: No sore throat. Cardiovascular: Denies chest pain. Respiratory: Denies shortness of breath. Gastrointestinal: No abdominal pain.  No nausea, no vomiting.  No diarrhea.  No constipation. Genitourinary: Negative for dysuria. Musculoskeletal: Negative for back pain. Skin: Negative for rash.  Redness and also lesion right lower lateral leg. Neurological: Negative for headaches, focal weakness or numbness.    ____________________________________________   PHYSICAL EXAM:  VITAL SIGNS: ED Triage Vitals  Enc Vitals Group     BP 04/19/18 1320 124/73     Pulse Rate 04/19/18 1320 74     Resp 04/19/18 1320 14     Temp 04/19/18 1320 98.2 F (36.8 C)     Temp Source 04/19/18 1320 Oral     SpO2 04/19/18 1320 97 %     Weight 04/19/18 1318 280 lb (127 kg)     Height 04/19/18 1318 5\' 9"  (1.753 m)     Head Circumference --      Peak Flow --      Pain Score 04/19/18 1317 0     Pain Loc --      Pain Edu? --  Excl. in GC? --    Constitutional: Alert and oriented. Well appearing and in no acute distress. Hematological/Lymphatic/Immunilogical: No cervical lymphadenopathy. Cardiovascular: Normal rate, regular rhythm. Grossly normal heart sounds.  Good peripheral circulation. Respiratory: Normal respiratory effort.  No retractions. Lungs CTAB. Gastrointestinal: Soft and nontender. No distention. No abdominal bruits. No CVA tenderness. Musculoskeletal: Lower extremity tenderness nor edema.  No joint effusions. Neurologic:  Normal speech and language. No gross focal neurologic deficits are appreciated. No gait  instability. Skin: Ulcer lesions measuring approximately 2 cm circumference.  Area has moderate training and erythema.  Psychiatric: Mood and affect are normal. Speech and behavior are normal.  ____________________________________________   LABS (all labs ordered are listed, but only abnormal results are displayed)  Labs Reviewed - No data to display ____________________________________________  EKG   ____________________________________________  RADIOLOGY  ED MD interpretation:    Official radiology report(s): No results found.  ____________________________________________   PROCEDURES  Procedure(s) performed: None  Procedures  Critical Care performed: No  ____________________________________________   INITIAL IMPRESSION / ASSESSMENT AND PLAN / ED COURSE  As part of my medical decision making, I reviewed the following data within the electronic MEDICAL RECORD NUMBER    Cellulitis right lower leg.  Area was cleaned and redressed.  Patient given discharge care instructions.  Patient advised to continue antibiotics.  Patient given a work note.  Patient advised to return back in 2 days for reevaluation.      ____________________________________________   FINAL CLINICAL IMPRESSION(S) / ED DIAGNOSES  Final diagnoses:  Cellulitis of right leg without foot     ED Discharge Orders        Ordered    oxyCODONE-acetaminophen (PERCOCET) 7.5-325 MG tablet  Every 6 hours PRN     04/19/18 1334       Note:  This document was prepared using Dragon voice recognition software and may include unintentional dictation errors.    Joni Reining, PA-C 04/19/18 1344    Merrily Brittle, MD 04/19/18 [phone]

## 2018-04-19 NOTE — ED Triage Notes (Signed)
Pt reports area feels better and he can bare weight on his right leg know.

## 2018-04-19 NOTE — ED Triage Notes (Signed)
Pt here for a wound recheck.

## 2018-11-15 ENCOUNTER — Emergency Department
Admission: EM | Admit: 2018-11-15 | Discharge: 2018-11-15 | Disposition: A | Discharge status: Home / Self Care | Payer: Self-pay | Attending: Emergency Medicine | Admitting: Emergency Medicine

## 2018-11-15 ENCOUNTER — Other Ambulatory Visit: Payer: Self-pay

## 2018-11-15 ENCOUNTER — Encounter: Payer: Self-pay | Admitting: Emergency Medicine

## 2018-11-15 DIAGNOSIS — B9789 Other viral agents as the cause of diseases classified elsewhere: Secondary | ICD-10-CM

## 2018-11-15 DIAGNOSIS — J069 Acute upper respiratory infection, unspecified: Secondary | ICD-10-CM | POA: Insufficient documentation

## 2018-11-15 DIAGNOSIS — F17200 Nicotine dependence, unspecified, uncomplicated: Secondary | ICD-10-CM | POA: Insufficient documentation

## 2018-11-15 DIAGNOSIS — J111 Influenza due to unidentified influenza virus with other respiratory manifestations: Secondary | ICD-10-CM | POA: Insufficient documentation

## 2018-11-15 MED ORDER — OSELTAMIVIR PHOSPHATE 75 MG PO CAPS: 75.0000 mg | ORAL_CAPSULE | Freq: Two times a day (BID) | ORAL | 0 refills | Status: AC

## 2018-11-15 NOTE — Discharge Instructions (Signed)
Your symptoms are consistent with influenza. Take the prescription meds as directed. Rest, hydrate, and stay at home. Follow-up with your provider or return to the ED for worsening symptoms.

## 2018-11-15 NOTE — ED Triage Notes (Signed)
Pt presents to ED nasal congestion, cough, fever, body aches x2 days.

## 2018-11-15 NOTE — ED Provider Notes (Signed)
[facility] Emergency Department Provider Note  ____________________________________________  Time: 1509  I have reviewed the triage vital signs and the nursing notes.   HISTORY  Chief Complaint Cough  HPI Samuel Zamora is a 28 y.o. male to the ED for evaluation of a 2-day complaint of cough, fevers, and congestion.  Patient describes a T-max of 63 F.  He also has noted sweats, fatigue, diarrhea and decreased appetite.  Patient describes a dry cough and denies any significant chest pain, shortness of breath, or wheezing.  Patient has been taking ibuprofen and TheraFlu for his symptoms.  He denies any sick contacts, recent travel, or other exposures.  Patient did not receive the seasonal flu vaccine.    History reviewed. No pertinent past medical history.  There are no active problems to display for this patient.  History reviewed. No pertinent surgical history.  Prior to Admission medications   Medication Sig Start Date End Date Taking? Authorizing Provider  oseltamivir (TAMIFLU) 75 MG capsule Take 1 capsule (75 mg total) by mouth 2 (two) times daily for 5 days. 11/15/18 11/20/18  Giovannina Mun, Charlesetta Ivory, PA-C   Allergies Patient has no known allergies.  History reviewed. No pertinent family history.  Social History Social History   Tobacco Use  . Smoking status: Current Every Day Smoker  . Smokeless tobacco: Never Used  Substance Use Topics  . Alcohol use: Yes  . Drug use: No    Review of Systems  Constitutional: Positive for fever/chills Eyes: No visual changes. ENT: No sore throat. Cardiovascular: Denies chest pain. Respiratory: Denies shortness of breath. Reports mild cough Gastrointestinal: No abdominal pain.  Reports a single episode of N/V. Positive for diarrhea. No constipation. Genitourinary: Negative for dysuria. Musculoskeletal: Negative for back pain. Reports generalized bodyaches. Skin: Negative for rash. Neurological:  Negative for headaches, focal weakness or numbness. ____________________________________________   PHYSICAL EXAM:  VITAL SIGNS: ED Triage Vitals  Enc Vitals Group     BP 11/15/18 1430 122/77     Pulse Rate 11/15/18 1430 100     Resp 11/15/18 1430 18     Temp 11/15/18 1430 99 F (37.2 C)     Temp Source 11/15/18 1430 Oral     SpO2 11/15/18 1430 99 %     Weight 11/15/18 1429 260 lb (117.9 kg)     Height 11/15/18 1429 5\' 8"  (1.727 m)     Head Circumference --      Peak Flow --      Pain Score 11/15/18 1428 5     Pain Loc --      Pain Edu? --      Excl. in GC? --    Constitutional: Alert and oriented. Well appearing and in no acute distress. Eyes: Conjunctivae are normal. EOMI. Head: Atraumatic. Nose: No congestion/rhinnorhea. Mouth/Throat: Mucous membranes are moist.  Oropharynx non-erythematous. Neck: No stridor.  No cervical spine tenderness to palpation. Hematological/Lymphatic/Immunilogical: No cervical lymphadenopathy. Cardiovascular: Normal rate, regular rhythm. Grossly normal heart sounds.  Good peripheral circulation. Respiratory: Normal respiratory effort.  No retractions. Lungs CTAB. Neurologic:  Normal speech and language. No gross focal neurologic deficits are appreciated. No gait instability. Skin:  Skin is warm, dry and intact. No rash noted. Psychiatric: Mood and affect are normal. Speech and behavior are normal. ____________________________________________   LABS (all labs ordered are listed, but only abnormal results are displayed)  Labs Reviewed - No data to display ____________________________________________  RADIOLOGY  Official radiology report(s): No results found.  ____________________________________________   PROCEDURES  Procedure(s) performed: None  Procedures  Critical Care performed: No ____________________________________________  INITIAL IMPRESSION / ASSESSMENT AND PLAN / ED COURSE  @[email or web address] @   Patient with ED  evaluation of a 2-day complaint of cough, congestion, fevers, body aches.  Patient clinical picture is consistent with a likely viral etiology including influenza.  We discussed the symptomatic treatment of and the usual course of influenza.  Patient is inclined to take Tamiflu for his symptoms in addition to over-the-counter medications I discussed.  He will be written a work for the remainder of this week, and is asked to return to the ED for worsening symptoms.  A prescription for Tamiflu is provided as requested. ____________________________________________   FINAL CLINICAL IMPRESSION(S) / ED DIAGNOSES  Final diagnoses:  Viral URI with cough  Influenza     ED Discharge Orders         Ordered    oseltamivir (TAMIFLU) 75 MG capsule  2 times daily     11/15/18 1536           Note:  This document was prepared using Dragon voice recognition software and may include unintentional dictation errors.    Lissa Hoard, PA-C 11/15/18 1553    Phineas Semen, MD 11/15/18 [phone]

## 2018-11-15 NOTE — ED Notes (Signed)
See triage note  States he developed cough with body aches on Sunday  Fever yesterday  Low grade fever noted on arrival

## 2020-03-04 ENCOUNTER — Emergency Department (HOSPITAL_COMMUNITY): Payer: Self-pay

## 2020-03-04 ENCOUNTER — Encounter (HOSPITAL_COMMUNITY): Payer: Self-pay | Admitting: Emergency Medicine

## 2020-03-04 ENCOUNTER — Other Ambulatory Visit: Payer: Self-pay

## 2020-03-04 ENCOUNTER — Emergency Department (HOSPITAL_COMMUNITY)
Admission: EM | Admit: 2020-03-04 | Discharge: 2020-03-04 | Disposition: A | Discharge status: Home / Self Care | Payer: Self-pay | Attending: Emergency Medicine | Admitting: Emergency Medicine

## 2020-03-04 DIAGNOSIS — F17200 Nicotine dependence, unspecified, uncomplicated: Secondary | ICD-10-CM | POA: Insufficient documentation

## 2020-03-04 DIAGNOSIS — R079 Chest pain, unspecified: Secondary | ICD-10-CM

## 2020-03-04 DIAGNOSIS — R0789 Other chest pain: Secondary | ICD-10-CM | POA: Insufficient documentation

## 2020-03-04 DIAGNOSIS — R0602 Shortness of breath: Secondary | ICD-10-CM | POA: Insufficient documentation

## 2020-03-04 LAB — CBC WITH DIFFERENTIAL/PLATELET
Abs Immature Granulocytes: 0.03 10*3/uL (ref 0.00–0.07)
Basophils Absolute: 0.1 10*3/uL (ref 0.0–0.1)
Basophils Relative: 1 %
Eosinophils Absolute: 0 10*3/uL (ref 0.0–0.5)
Eosinophils Relative: 0 %
HCT: 53.1 % — ABNORMAL HIGH (ref 39.0–52.0)
Hemoglobin: 17 g/dL (ref 13.0–17.0)
Immature Granulocytes: 0 %
Lymphocytes Relative: 21 %
Lymphs Abs: 2.3 10*3/uL (ref 0.7–4.0)
MCH: 27.2 pg (ref 26.0–34.0)
MCHC: 32 g/dL (ref 30.0–36.0)
MCV: 85.1 fL (ref 80.0–100.0)
Monocytes Absolute: 0.7 10*3/uL (ref 0.1–1.0)
Monocytes Relative: 6 %
Neutro Abs: 8 10*3/uL — ABNORMAL HIGH (ref 1.7–7.7)
Neutrophils Relative %: 72 %
Platelets: 238 10*3/uL (ref 150–400)
RBC: 6.24 MIL/uL — ABNORMAL HIGH (ref 4.22–5.81)
RDW: 17.4 % — ABNORMAL HIGH (ref 11.5–15.5)
WBC: 11.1 10*3/uL — ABNORMAL HIGH (ref 4.0–10.5)
nRBC: 0 % (ref 0.0–0.2)

## 2020-03-04 LAB — BASIC METABOLIC PANEL
Anion gap: 8 (ref 5–15)
BUN: 20 mg/dL (ref 6–20)
CO2: 25 mmol/L (ref 22–32)
Calcium: 8.9 mg/dL (ref 8.9–10.3)
Chloride: 106 mmol/L (ref 98–111)
Creatinine, Ser: 1.03 mg/dL (ref 0.61–1.24)
GFR calc Af Amer: >60 mL/min (ref >60)
GFR calc non Af Amer: >60 mL/min (ref >60)
Glucose, Bld: 95 mg/dL (ref 70–99)
Potassium: 3.8 mmol/L (ref 3.5–5.1)
Sodium: 139 mmol/L (ref 135–145)

## 2020-03-04 LAB — TROPONIN I (HIGH SENSITIVITY): Troponin I (High Sensitivity): <2 ng/L (ref <18)

## 2020-03-04 MED ORDER — ALBUTEROL SULFATE (2.5 MG/3ML) 0.083% IN NEBU: 5.0000 mg | INHALATION_SOLUTION | Freq: Once | RESPIRATORY_TRACT | Status: DC

## 2020-03-04 NOTE — ED Triage Notes (Signed)
Patient states he woke up this morning struggling to breath. Patient states he has no pain but has tightness in his chest.

## 2020-03-04 NOTE — ED Provider Notes (Signed)
[facility]-EMERGENCY DEPT Provider Note   CSN: [id number] Arrival date & time: 03/04/20  0405     History Chief Complaint  Patient presents with  . Shortness of Breath    Samuel Zamora is a 29 y.o. male.  Presents to ER with concern for chest pain shortness of breath.  Patient states that woke up late last night/early in the morning around 1 or 2AM with sensation of shortness of breath and chest discomfort.  Described as tightness, central, nonradiating.  Lasted less than an hour, completely resolved.  Shortness of breath also resolved.  Currently no symptoms.  No abdominal pain, vomiting.  No fevers or cough.  Smokes hookah, does not smoke cigarettes.  Denies family history CAD.  HPI     History reviewed. No pertinent past medical history.  There are no problems to display for this patient.   History reviewed. No pertinent surgical history.     History reviewed. No pertinent family history.  Social History   Tobacco Use  . Smoking status: Current Every Day Smoker  . Smokeless tobacco: Never Used  Substance Use Topics  . Alcohol use: Yes  . Drug use: No    Home Medications Prior to Admission medications   Not on File    Allergies    Patient has no known allergies.  Review of Systems   Review of Systems  Constitutional: Negative for chills and fever.  HENT: Negative for ear pain and sore throat.   Eyes: Negative for pain and visual disturbance.  Respiratory: Positive for shortness of breath. Negative for cough.   Cardiovascular: Positive for chest pain. Negative for palpitations.  Gastrointestinal: Negative for abdominal pain and vomiting.  Genitourinary: Negative for dysuria and hematuria.  Musculoskeletal: Negative for arthralgias and back pain.  Skin: Negative for color change and rash.  Neurological: Negative for seizures and syncope.  All other systems reviewed and are negative.   Physical Exam Updated Vital Signs BP (!)  146/109   Pulse 72   Temp 98.3 F (36.8 C) (Oral)   Resp 18   Ht 5\' 9"  (1.753 m)   Wt 131.5 kg   SpO2 100%   BMI 42.83 kg/m   Physical Exam Vitals and nursing note reviewed.  Constitutional:      Appearance: He is well-developed.  HENT:     Head: Normocephalic and atraumatic.  Eyes:     Conjunctiva/sclera: Conjunctivae normal.  Cardiovascular:     Rate and Rhythm: Normal rate and regular rhythm.     Heart sounds: No murmur heard.   Pulmonary:     Effort: Pulmonary effort is normal. No respiratory distress.     Breath sounds: Normal breath sounds.  Abdominal:     Palpations: Abdomen is soft.     Tenderness: There is no abdominal tenderness.  Musculoskeletal:     Cervical back: Neck supple.  Skin:    General: Skin is warm and dry.     Capillary Refill: Capillary refill takes less than 2 seconds.  Neurological:     General: No focal deficit present.     Mental Status: He is alert.     ED Results / Procedures / Treatments   Labs (all labs ordered are listed, but only abnormal results are displayed) Labs Reviewed  CBC WITH DIFFERENTIAL/PLATELET - Abnormal; Notable for the following components:      Result Value   WBC 11.1 (*)    RBC 6.24 (*)    HCT 53.1 (*)  RDW 17.4 (*)    Neutro Abs 8.0 (*)    All other components within normal limits  BASIC METABOLIC PANEL  TROPONIN I (HIGH SENSITIVITY)    EKG EKG Interpretation  Date/Time:  Monday March 04 2020 04:43:27 EDT Ventricular Rate:  83 PR Interval:    QRS Duration: 101 QT Interval:  380 QTC Calculation: 447 R Axis:   -11 Text Interpretation: Sinus rhythm 12 Lead; Mason-Likar Confirmed by Madalyn Rob [phone]) on 03/04/2020 7:06:51 AM   Radiology DG Chest 2 View  Result Date: 03/04/2020 CLINICAL DATA:  Shortness of breath. EXAM: CHEST - 2 VIEW COMPARISON:  Two-view chest x-ray 07/12/2016 FINDINGS: Heart size is exaggerate by low lung volumes. No edema or effusion is present. No focal airspace disease  is present. The visualized soft tissues and bony thorax are unremarkable. IMPRESSION: 1. Low lung volumes. 2. No acute cardiopulmonary disease. Electronically Signed   By: San Morelle M.D.   On: 03/04/2020 06:40    Procedures Procedures (including critical care time)  Medications Ordered in ED Medications - No data to display  ED Course  I have reviewed the triage vital signs and the nursing notes.  Pertinent labs & imaging results that were available during my care of the patient were reviewed by me and considered in my medical decision making (see chart for details).    MDM Rules/Calculators/A&P                          29 year old male presents to ER after he had an episode of chest discomfort and shortness of breath last night.  In ER, patient is well-appearing, no ongoing symptoms, stable vital signs.  EKG without acute ischemic change, troponin within normal limits.  Given these findings, description of symptoms, very low suspicion for ACS, cardiac etiology.  CXR negative for pneumonia, PTX.   Lab work otherwise grossly normal.  No hypoxia, no tachycardia, no tachypnea, no PE risk factors, doubt acute PE.  Recommend follow-up with primary.  Will discharge home.    After the discussed management above, the patient was determined to be safe for discharge.  The patient was in agreement with this plan and all questions regarding their care were answered.  ED return precautions were discussed and the patient will return to the ED with any significant worsening of condition.   Final Clinical Impression(s) / ED Diagnoses Final diagnoses:  Chest pain, unspecified type  Shortness of breath    Rx / DC Orders ED Discharge Orders    None       Lucrezia Starch, MD 03/04/20 1007

## 2020-03-04 NOTE — ED Notes (Signed)
D/c paperwork reviewed with pt, no questions or concerns expressed at this time. Ambulatory at time of discharge, on room air.

## 2020-03-04 NOTE — ED Notes (Signed)
Pt ambulatory from triage, on room air.

## 2020-03-04 NOTE — Discharge Instructions (Addendum)
Recommend follow-up with primary care doctor regarding symptoms you are experiencing today.  If you have recurrent chest pain, difficulty in breathing or other new concerning symptom, recommend return to ER for reassessment.

## 2021-01-28 IMAGING — CR DG CHEST 2V
2 series · 2 of 2 positions shown · non-contrast
Comparison: Two-view chest x-ray 07/12/2016

CLINICAL DATA: Shortness of breath.

EXAM:
CHEST - 2 VIEW

[w chest pa]
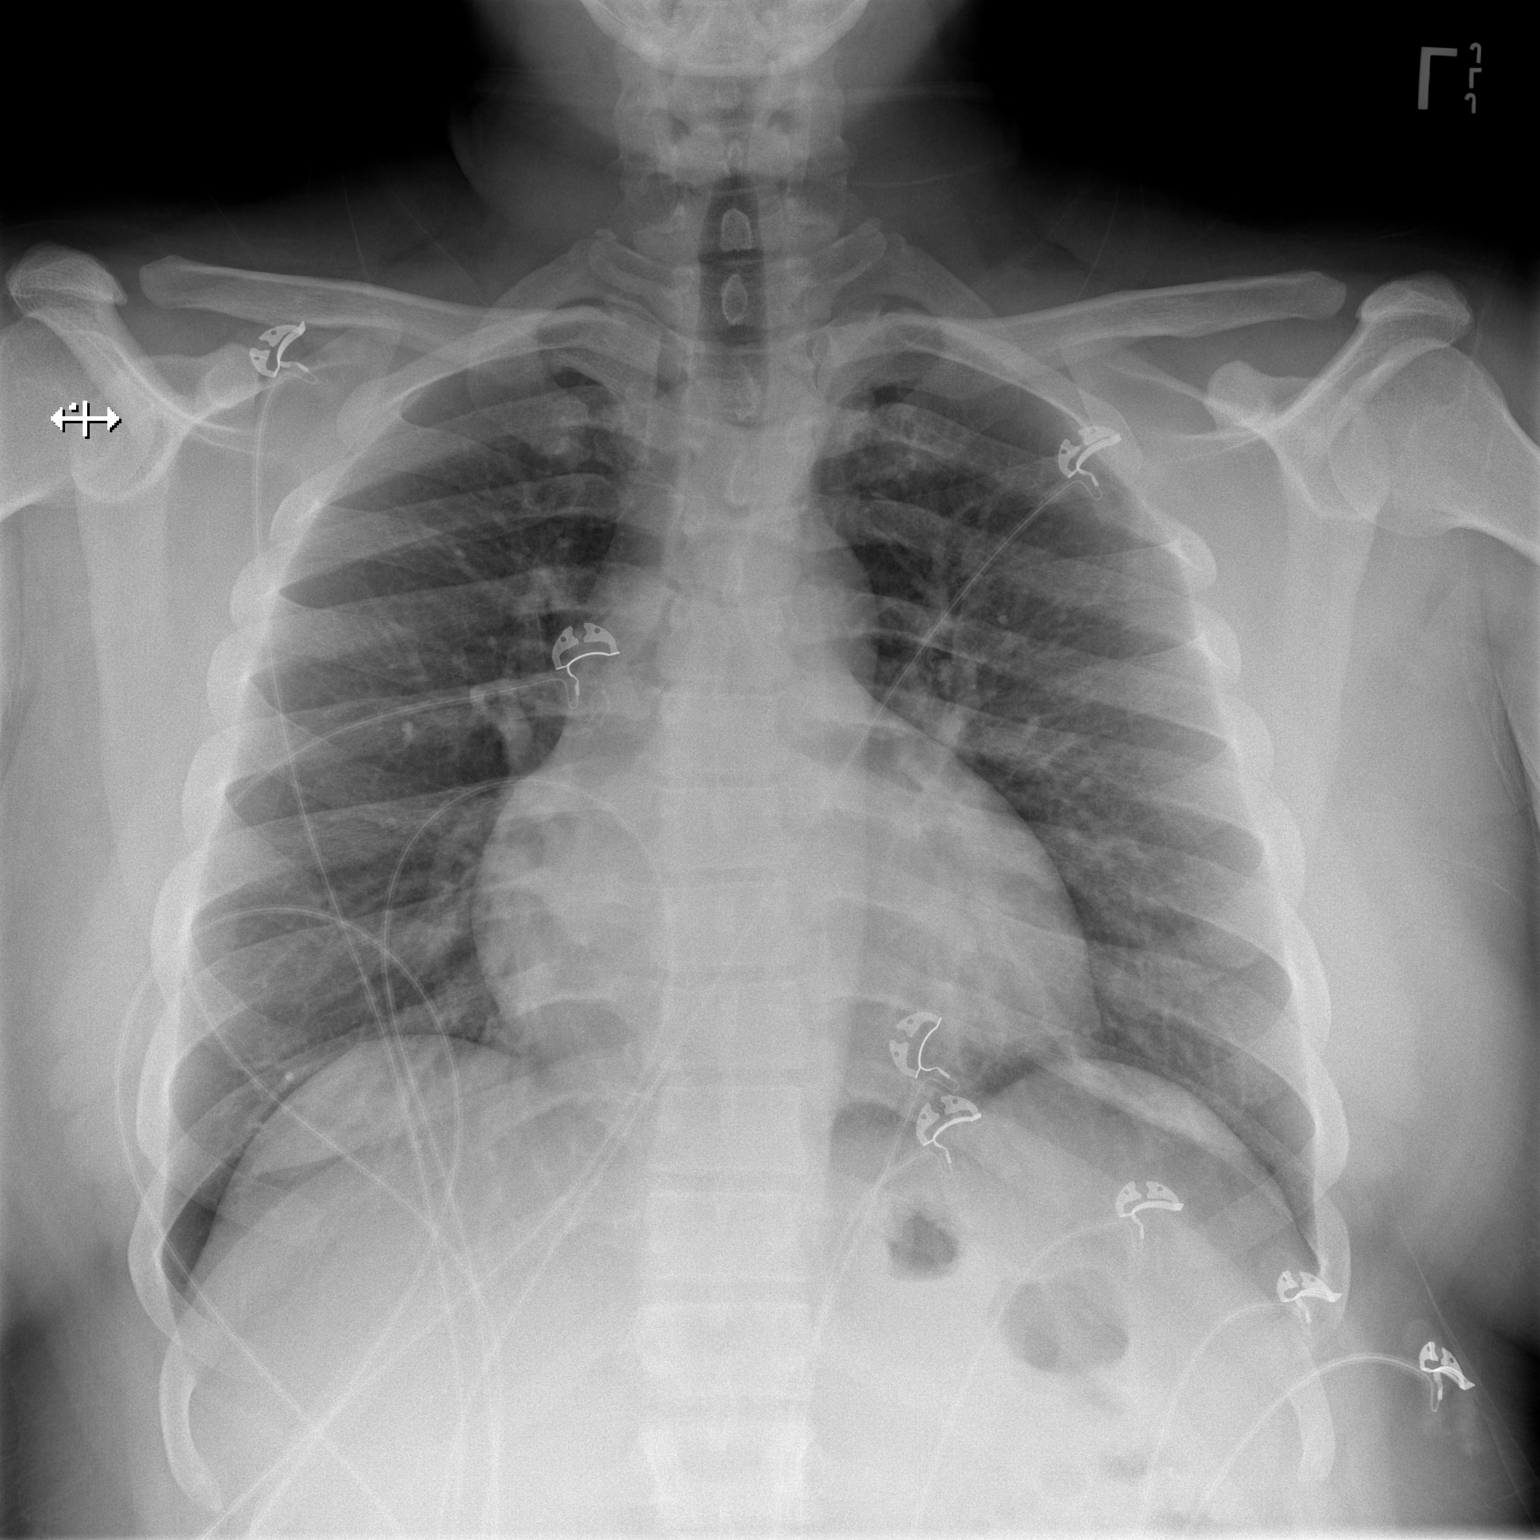

[w chest lat]
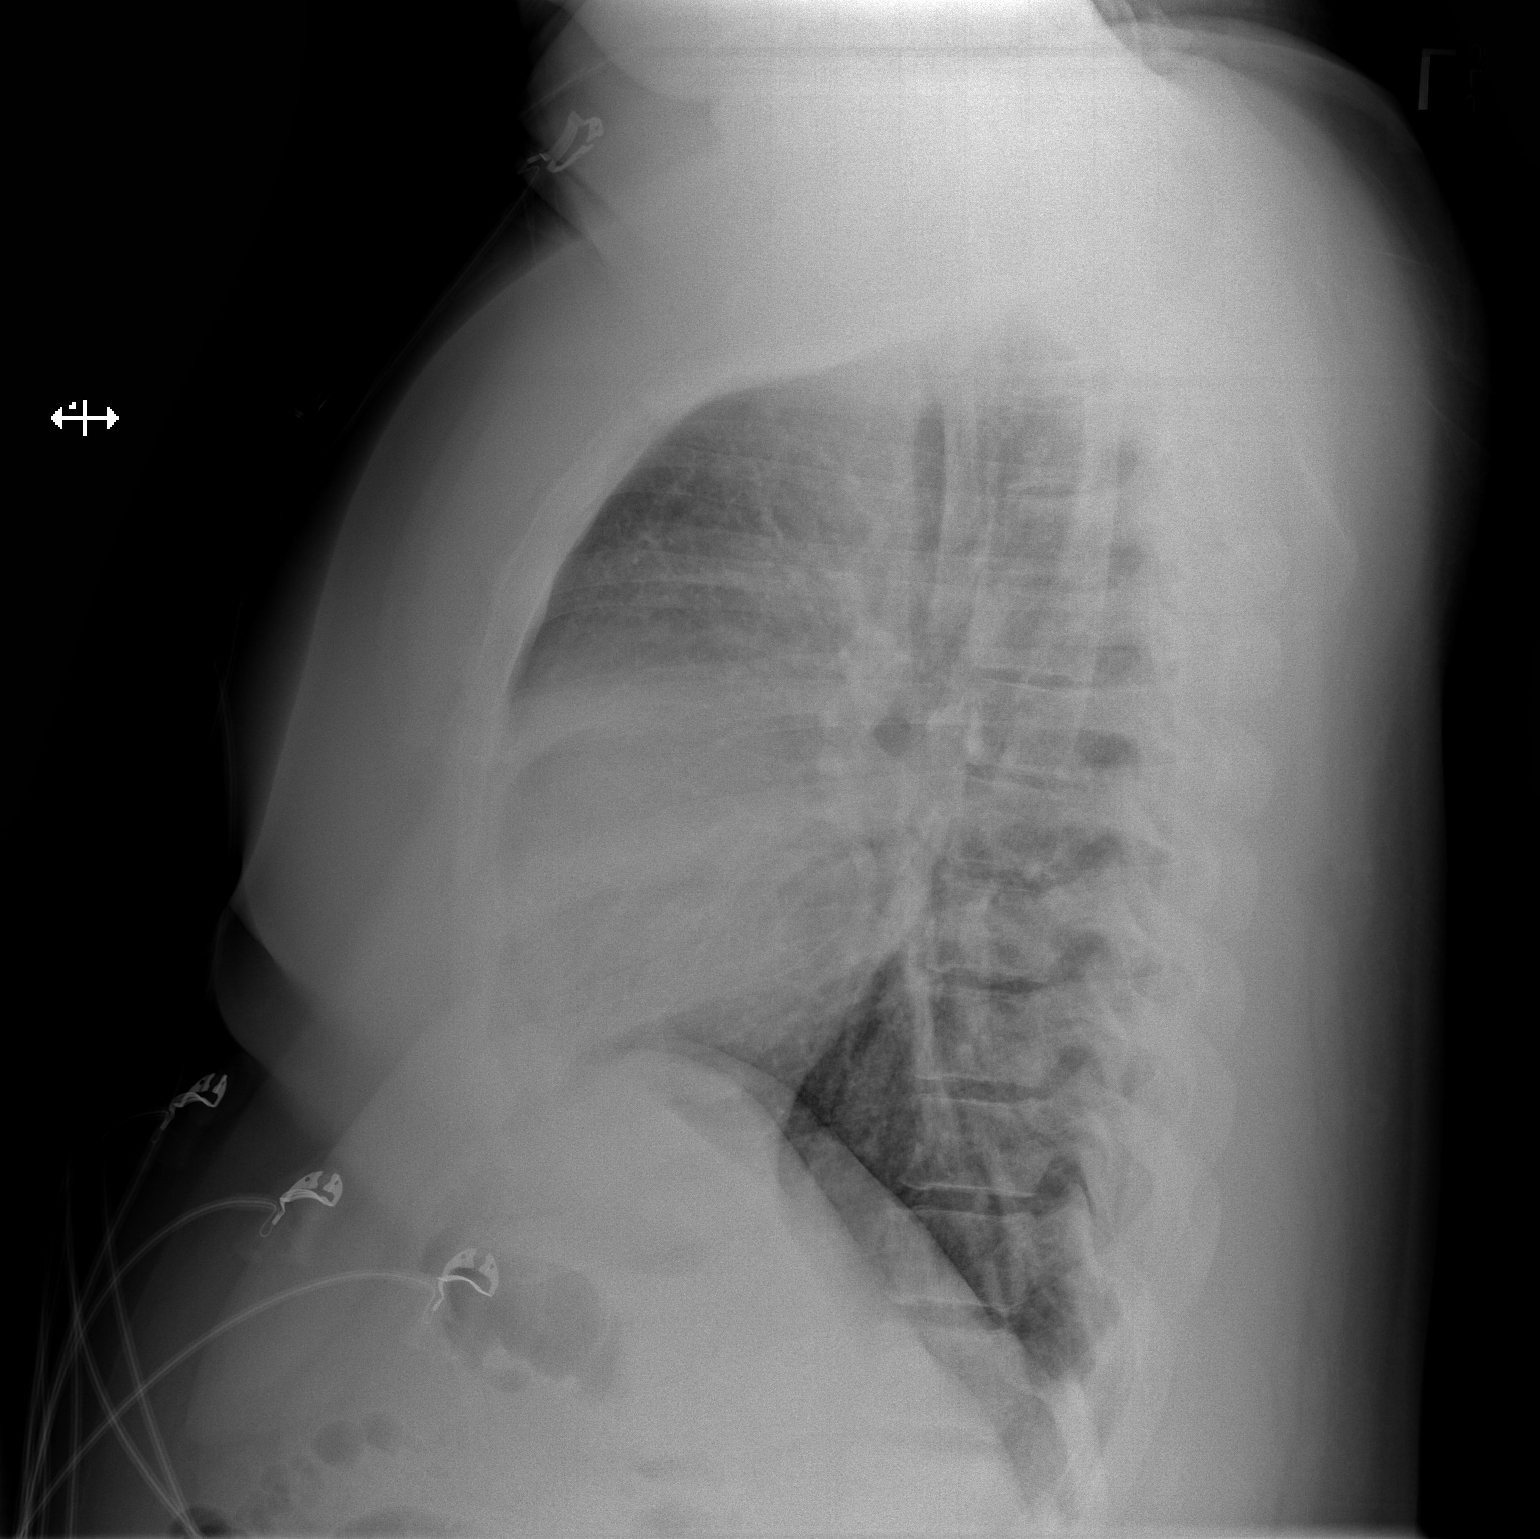

[2 of 2 positions shown; findings below may reference images not displayed]

FINDINGS: Heart size is exaggerate by low lung volumes. No edema or effusion
is present. No focal airspace disease is present. The visualized
soft tissues and bony thorax are unremarkable.
IMPRESSION: 1. Low lung volumes.
2. No acute cardiopulmonary disease.
